# Patient Record
Sex: Female | Born: 1960 | Race: Black or African American | Hispanic: No | Marital: Married | Smoking: Current every day smoker
Health system: Southern US, Community
[De-identification: ages and names within clinical notes are randomized; demographics above are authoritative.]

## PROBLEM LIST (undated history)

## (undated) DIAGNOSIS — I10 Essential (primary) hypertension: Secondary | ICD-10-CM

## (undated) HISTORY — PX: ABDOMINAL HYSTERECTOMY: SHX81

---

## 2001-02-05 ENCOUNTER — Encounter: Admission: RE | Admit: 2001-02-05 | Discharge: 2001-02-05 | Payer: Self-pay | Admitting: Family Medicine

## 2001-02-05 ENCOUNTER — Encounter: Payer: Self-pay | Admitting: Family Medicine

## 2001-04-22 ENCOUNTER — Inpatient Hospital Stay (HOSPITAL_COMMUNITY): Admission: RE | Admit: 2001-04-22 | Discharge: 2001-04-24 | Payer: Self-pay | Admitting: Obstetrics and Gynecology

## 2001-04-22 ENCOUNTER — Encounter (INDEPENDENT_AMBULATORY_CARE_PROVIDER_SITE_OTHER): Payer: Self-pay

## 2002-10-04 ENCOUNTER — Ambulatory Visit (HOSPITAL_COMMUNITY): Admission: RE | Admit: 2002-10-04 | Discharge: 2002-10-04 | Payer: Self-pay | Admitting: Family Medicine

## 2002-10-04 ENCOUNTER — Encounter: Payer: Self-pay | Admitting: Family Medicine

## 2002-10-29 ENCOUNTER — Encounter: Payer: Self-pay | Admitting: Family Medicine

## 2002-10-29 ENCOUNTER — Ambulatory Visit (HOSPITAL_COMMUNITY): Admission: RE | Admit: 2002-10-29 | Discharge: 2002-10-29 | Payer: Self-pay | Admitting: Family Medicine

## 2003-05-16 ENCOUNTER — Other Ambulatory Visit: Admission: RE | Admit: 2003-05-16 | Discharge: 2003-05-16 | Payer: Self-pay | Admitting: Obstetrics and Gynecology

## 2003-05-23 ENCOUNTER — Encounter: Payer: Self-pay | Admitting: Family Medicine

## 2003-05-23 ENCOUNTER — Ambulatory Visit (HOSPITAL_COMMUNITY): Admission: RE | Admit: 2003-05-23 | Discharge: 2003-05-23 | Payer: Self-pay | Admitting: Family Medicine

## 2004-12-31 ENCOUNTER — Encounter: Admission: RE | Admit: 2004-12-31 | Discharge: 2004-12-31 | Payer: Self-pay | Admitting: Family Medicine

## 2005-05-08 ENCOUNTER — Ambulatory Visit (HOSPITAL_COMMUNITY): Admission: RE | Admit: 2005-05-08 | Discharge: 2005-05-08 | Payer: Self-pay | Admitting: Family Medicine

## 2005-05-13 ENCOUNTER — Encounter (HOSPITAL_COMMUNITY): Admission: RE | Admit: 2005-05-13 | Discharge: 2005-06-12 | Payer: Self-pay | Admitting: Family Medicine

## 2006-01-24 ENCOUNTER — Encounter: Admission: RE | Admit: 2006-01-24 | Discharge: 2006-01-24 | Payer: Self-pay | Admitting: Obstetrics and Gynecology

## 2007-09-18 ENCOUNTER — Emergency Department (HOSPITAL_COMMUNITY): Admission: EM | Admit: 2007-09-18 | Discharge: 2007-09-18 | Payer: Self-pay | Admitting: *Deleted

## 2008-01-01 ENCOUNTER — Encounter: Admission: RE | Admit: 2008-01-01 | Discharge: 2008-01-01 | Payer: Self-pay | Admitting: Obstetrics and Gynecology

## 2008-08-14 IMAGING — CR DG FOOT COMPLETE 3+V*R*
3 series · 3 of 3 positions shown · non-contrast
Comparison: none

CLINICAL DATA: Redness and swelling of right foot plus pain.
 RIGHT FOOT ? 3 VIEW:

[t foot ap right]
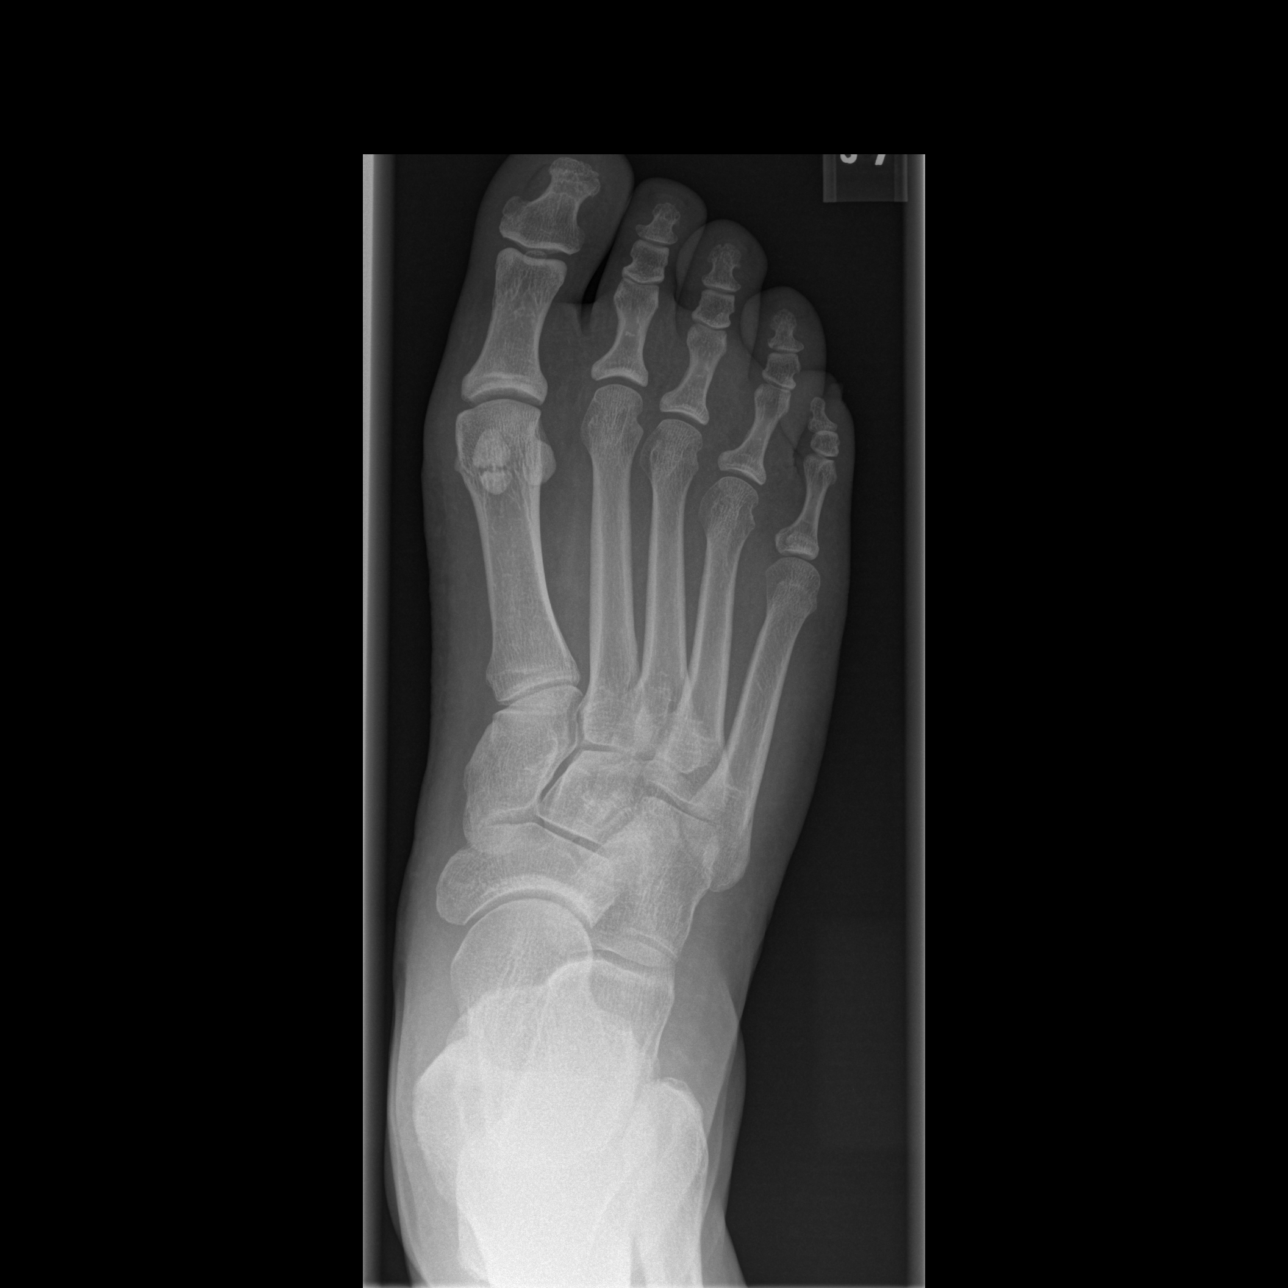

[t foot oblique right]
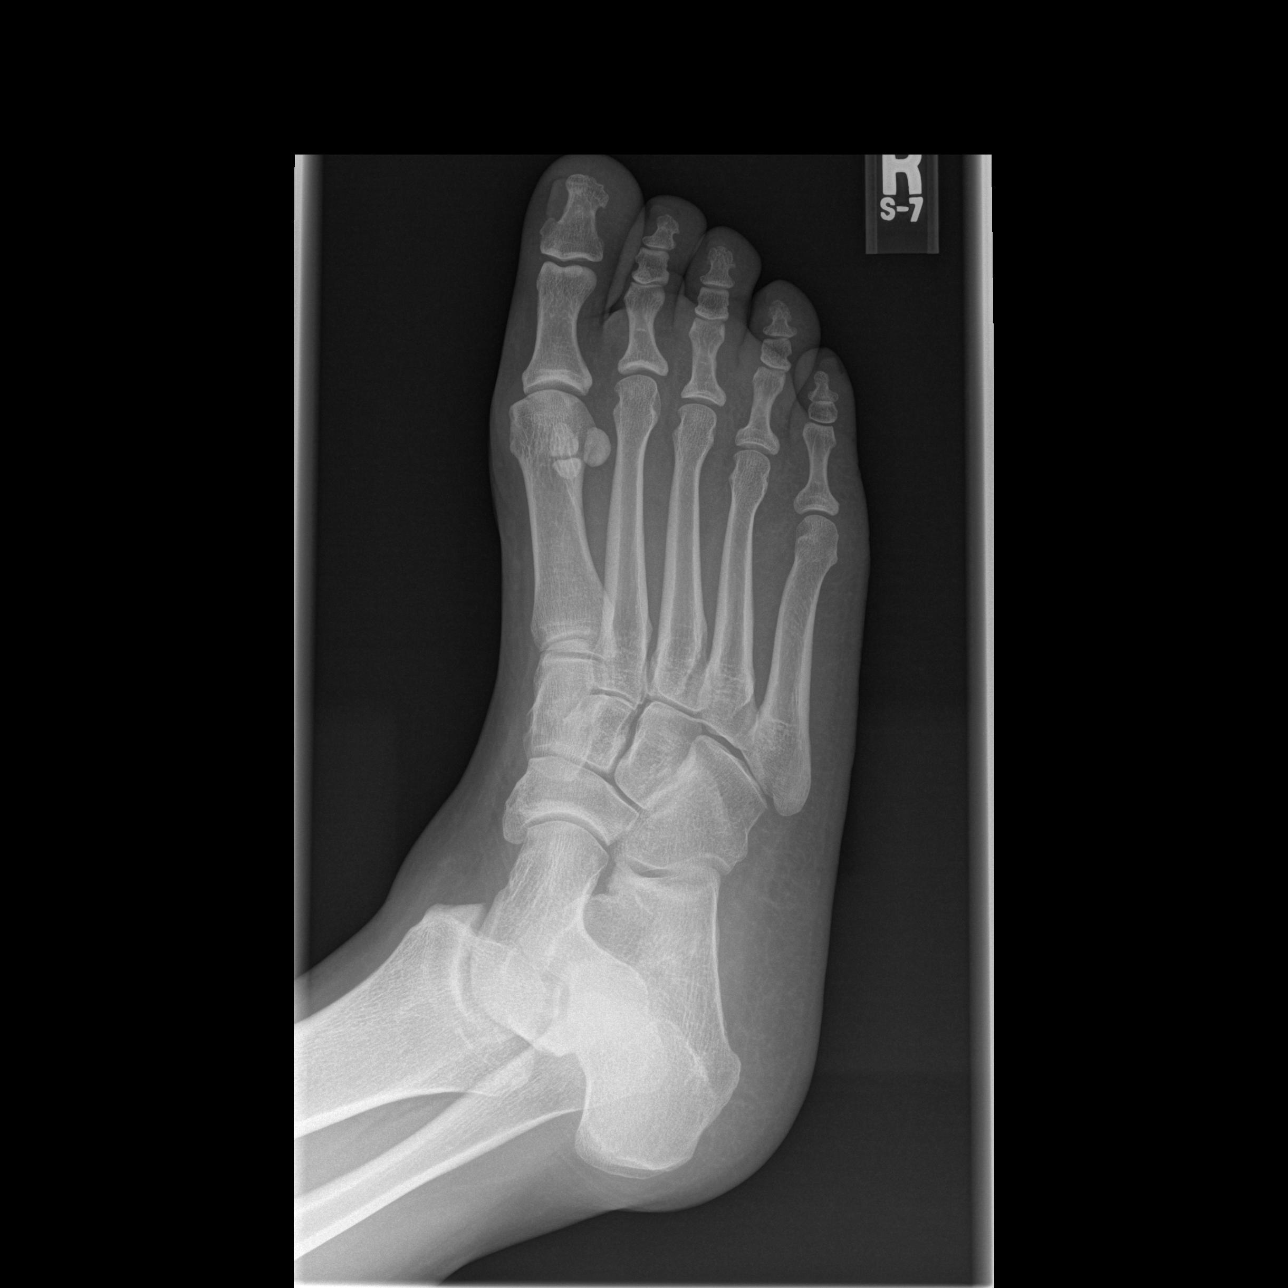

[t foot lat right]
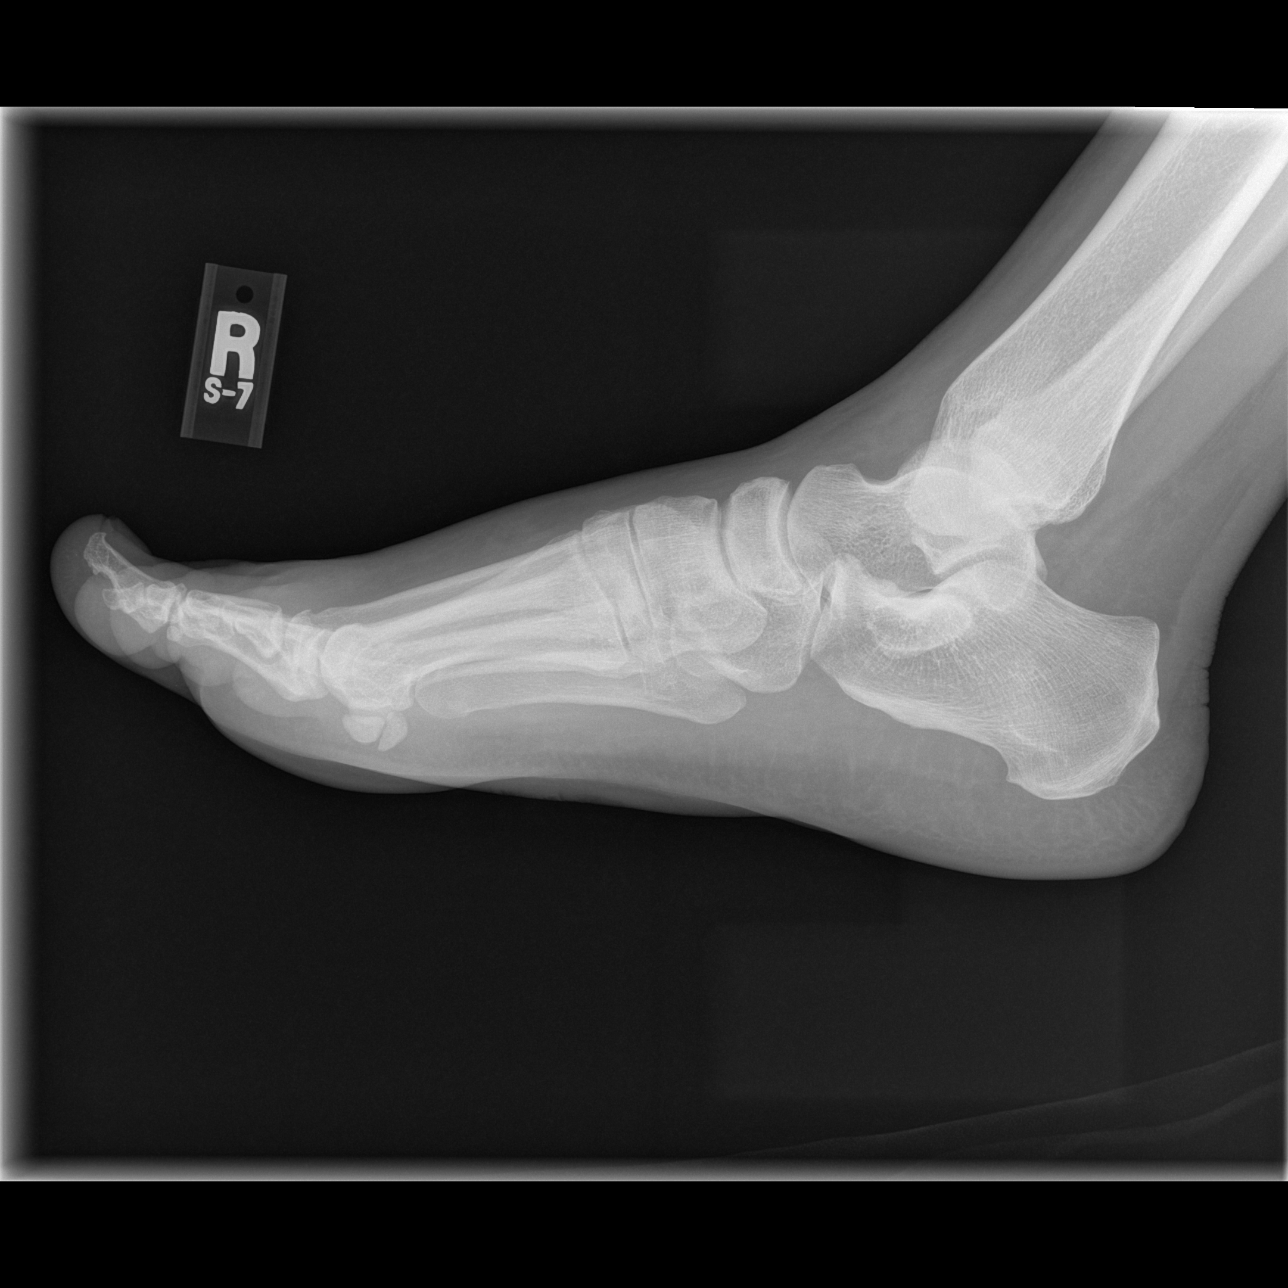

[3 of 3 positions shown; findings below may reference images not displayed]

FINDINGS: No acute bony abnormality.  No periosteal reaction.  Soft tissue swelling.  Mild bunion formation involving the great toe metatarsal.
IMPRESSION: No acute bony abnormality.

## 2009-01-04 ENCOUNTER — Encounter: Admission: RE | Admit: 2009-01-04 | Discharge: 2009-01-04 | Payer: Self-pay | Admitting: Obstetrics and Gynecology

## 2009-09-30 ENCOUNTER — Emergency Department (HOSPITAL_COMMUNITY): Admission: EM | Admit: 2009-09-30 | Discharge: 2009-09-30 | Payer: Self-pay | Admitting: Family Medicine

## 2010-02-02 ENCOUNTER — Encounter: Admission: RE | Admit: 2010-02-02 | Discharge: 2010-02-02 | Payer: Self-pay | Admitting: Obstetrics and Gynecology

## 2010-12-16 ENCOUNTER — Encounter: Payer: Self-pay | Admitting: Obstetrics and Gynecology

## 2011-02-19 ENCOUNTER — Other Ambulatory Visit: Payer: Self-pay | Admitting: Obstetrics and Gynecology

## 2011-02-19 DIAGNOSIS — Z1231 Encounter for screening mammogram for malignant neoplasm of breast: Secondary | ICD-10-CM

## 2011-02-25 ENCOUNTER — Ambulatory Visit
Admission: RE | Admit: 2011-02-25 | Discharge: 2011-02-25 | Disposition: A | Discharge status: Home / Self Care | Payer: 59 | Source: Ambulatory Visit | Attending: Obstetrics and Gynecology | Admitting: Obstetrics and Gynecology

## 2011-02-25 DIAGNOSIS — Z1231 Encounter for screening mammogram for malignant neoplasm of breast: Secondary | ICD-10-CM

## 2011-02-26 ENCOUNTER — Emergency Department (HOSPITAL_COMMUNITY)
Admission: EM | Admit: 2011-02-26 | Discharge: 2011-02-26 | Disposition: A | Discharge status: Home / Self Care | Payer: 59 | Attending: Emergency Medicine | Admitting: Emergency Medicine

## 2011-02-26 ENCOUNTER — Emergency Department (HOSPITAL_COMMUNITY): Payer: 59

## 2011-02-26 DIAGNOSIS — J3489 Other specified disorders of nose and nasal sinuses: Secondary | ICD-10-CM | POA: Insufficient documentation

## 2011-02-26 DIAGNOSIS — E039 Hypothyroidism, unspecified: Secondary | ICD-10-CM | POA: Insufficient documentation

## 2011-02-26 DIAGNOSIS — I10 Essential (primary) hypertension: Secondary | ICD-10-CM | POA: Insufficient documentation

## 2011-02-26 DIAGNOSIS — R51 Headache: Secondary | ICD-10-CM | POA: Insufficient documentation

## 2011-04-12 NOTE — Op Note (Signed)
[facility]  Patient:    Kelli Figueroa, Kelli Figueroa                   MRN: [id number] Proc. Date: 04/22/01 Attending:  Cordelia Pen A. Rosalio Macadamia, M.D.                           Operative Report  PREOPERATIVE DIAGNOSIS:       Fibroid uterus.  INDICATIONS:                  The patient is a 50 year old G3, P3-0-03 woman who is having menstrual periods every 24-28 days, lasting 6-7 days.  The patient has excessively heavy flow.  The patient has had known fibroids for over 10 years, but most recently has noted increased pain, pressure and excessive bleeding.  Ultrasound was performed showing fibroid uterus approximately 20-22 weeks in size.  The patient was treated with Lupron Depot in February, which is lasting three months.  The patient has been evaluated in the office since, with some deceased size to her uterus to 18-20 week size. Because of these findings, the patient is brought to the  operating room for total abdominal hysterectomy.  FINDINGS:                     Large, irregular uterus with multiple fibroids present, approximately 18-20 week size.  Normal fallopian tubes and ovaries.  SURGEON:                       Sherry A. Rosalio Macadamia, M.D.  DESCRIPTION OF PROCEDURE:     The patient was brought into the operating room and given adequate general anesthesia.  She was placed in the frog-leg position.  The abdomen and vagina were washed with Betadine.  A Foley catheter was placed in the bladder.  The patient was taken out of the frog-leg position and draped in a sterile fashion.  A previous midline incision was present. This was infiltrated with 0.5% Marcaine.  The old incision was excised and then incision was then brought down sharply through the fascia.  The fascia was incised sharply.  The peritoneum was identified and entered sharply.  This incision was extended superiorly and inferiorly under direct visualization. The uterus was delivered through the  abdominal incision with pressure and manipulation.  A long Kelly clamp was placed on the utero-ovarian ligaments. The right round ligament was identified.  It had been avulsed slightly.  This was doubly clamped.  It was cut.  It was suture ligated with 0 Vicryl ligatures.  Some small adhesions were dissected.  The utero-ovarian ligament was identified.  It was doubly clamped.  It was cut.  It was free tied with 0 Vicryl free tie and suture ligated with 0 Vicryl ligature.  The left round ligament was then identified.  It was tied with 0 Vicryl ligatures x 2 and cut in the middle.  The anterior leaf of the broad ligament was then incised and the bladder was developed off of the lower uterine segment.  The left ovary was attached to the uterus with a fallopian tube status post tubal ligation. The adhesions were dissected free with sharp and blunt dissection.  The utero-ovarian ligament was then doubly clamped.  It was cut.  It was free tied with 0 Vicryl free tie and then sutured with 0 Vicryl ligature.  The uterine arteries were skeletonized.  The bladder was developed  off of the lower uterine segment more.  The right uterine artery was doubly clamped, cut and suture ligated with 0 Vicryl ligature.  The beginning of the cardinal ligament was clamped, cut and suture ligated with 0 Vicryl ligature ______ with a second tie around the uterine.  There was significant back bleeding, although a second clamp had been placed to prevent this.  Once this was stopped with back sutures, the left uterine arteries were clamped x 2, cut and suture ligated with 0 Vicryl.  The beginning of the cardinal ligament was also clamped, cut and suture ligated to put a second tie around the uterine arteries.  THe cardinal ligaments were clamped, cut and suture ligated with 0 Vicryl ligature.  A small hematoma was felt to be forming in the right broad ligament.  This bleeding was stopped with another suture.  The ureters  were identified below each side of these in the pelvic side wall below the area of dissection.  The uterosacral ligaments clamped separately, cut and suture ligated with 0 Vicryl ligatures.  These stitches were continued until the angle stitches could be taken.  The angles were clamped, cut and suture ligated with 0 Vicryl.  The cervix was then excised from the vagina.  Prior to the extensive cardinal ligament clamping and dissection, the uterus had been removed from the cervix for better visualization.  The vagina was grasped with four Kocher clamps.  Vaginal tissues were closed with 0 Vicryl in a running locked whip stitch.  Once adequate hemostasis was present, the vaginal cuff was then closed with 0 Vicryl mattress-type sutures.  Bleeders were cauterized along the vaginal edges, along the peritoneum by the bladder and along the edge of the bladder.  A bleeder was still felt to be present in the left broad ligament.  This was clamped with a right angle clamp.  The area below the uterine arteries were then sutured with 3-0 Vicryl in a figure-of-eight stitch.  A small amount of bleeding was still present near the uterine artery on the right.  This was clamped with a Rogers clamp and suture ligated with 0 Vicryl ligature.  Once this was done, adequate hemostasis was present.  The hematoma that had been present approximately 2 cm in size was deflated and was not growing.  The ureters were again checked and felt to be well below the area of suturing.  There were no abnormalities seen.  The left tube and ovary was inspected. There was a small amount of bleeding below it.  This was cauterized and then an area was grasped with a right-angle clamp and free tie was taken with adequate hemostasis.  The left tube and ovary was tied to the left round ligament.  There was an area below it that was open that was felt to possibly be a risk for small bowel getting caught.  Therefore, the peritoneum was  closed between the round ligament and the tube and ovary to prevent any future small bowel obstruction.  The right tube and ovary was tied to the right round ligament to try to keep the ovaries out of the pelvis.  The  abdomen was irrigated with large amounts of warm saline.  Small bleeders were cauterized.  Adequate hemostasis was felt to be present.  Packs were removed from the abdomen.  A Balfour retractor that had been placed after removing the fundus of the uterus was then removed.  The peritoneal edges were cauterized for any bleeders.  The fascia was  closed with 0 PDS x 2 stitches running to the midline.  The incision was irrigated.  Bleeders were cauterized.  Some subcutaneous fat was excised with cautery in order to expedite the closure of the incision.  The subcutaneous tissues were then approximated with 3-0 plain in interrupted stitches to decrease any tension on the incision.  The incision was then closed with 4-0 Vicryl in subcuticular running stitch.  Adequate hemostasis was present.  Steri-Strips were placed on the incision.  Sterile bandages were placed on the incision.  The patient was awakened.  She was extubated.  She was moved from the operating table to a stretcher in stable condition.  COMPLICATIONS:                None.  ESTIMATED BLOOD LOSS:         500 cc. DD:  04/22/01 TD:  04/22/01 Job: [id number]

## 2011-04-12 NOTE — Discharge Summary (Signed)
[facility]  Patient:    Kelli Figueroa, Kelli Figueroa                  MRN: [id number] Adm. Date:  [id number] Disc. Date: [id number] Attending:  Morene Antu                           Discharge Summary  PROBLEM #1:                   Fibroid uterus.  SUBJECTIVE:                   The patient is a 50 year old, G3, P3-0-0-3 woman who is having menstrual periods every 24 to 28 days x 57 days. The patient has had excessively heavy menstrual flow for a long time. The patient has noted increasing pain, pressure, and excessive bleeding recently. The patient had an ultrasound performed showing a 20- to 22-week size uterus. She was treated with Lupron Depot in February which lasted three months. The patient had a significant reduction in size of her uterus down to approximately 18 to 20-week size. Because of this, the patient is brought to the operating room for hysterectomy.  OBJECTIVE: PHYSICAL EXAMINATION:  HEENT:                        Within normal limits.  NECK:                         Without any lymphadenopathy. Thyroid without nodule.  CHEST:                        Clear to auscultation.  HEART:                        Regular rhythm without murmur.  BREASTS:                      Without mass.  BACK:                         CVA nontender.  ABDOMEN:                      Soft nontender with an 18- to 20-week size uterus.  PELVIC:                       External genitalia within normal limits. Cervix within normal limits. Uterus 18- to 20-week size. Adnexa nonpalpable.  HOSPITAL COURSE:              The patient was admitted and brought to the operating room where a TAH was performed. There were no intraoperative or postoperative complications. Postoperatively, the patient did well. She remained afebrile. Her vital signs remained stable. Preoperative hematocrit was 39.8, postoperative hematocrit on her first postoperative day was  30.0. Urinalysis was within normal limits. Uterine pathology revealed benign endometrium, benign leiomyomata, and cervix with no pathologic abnormalities. The patient was discharged to home on her second postoperative day.  ASSESSMENT:                   Stable, status post total abdominal hysterectomy.  PLAN:  Follow up in the office in two to three weeks. The patient will call if she has a temperature greater than 101, heavy bleeding, or severe pain. She is discharged to home on Advil, Aleve, or Tylenol p.r.n. and Tylox, #15, to take one to two q.4h. p.r.n. DD:  05/13/01 TD:  05/13/01 Job: [id number]

## 2012-01-23 IMAGING — CT CT HEAD W/O CM
2 series · 16 of 30 positions shown, 20 images · non-contrast
Comparison: None.

CLINICAL DATA: Headache for 2 days, high blood pressure

CT HEAD WITHOUT CONTRAST
TECHNIQUE: Contiguous axial images were obtained from the base of
the skull through the vertex without contrast.

[Series 2: head w/o · axial · non-contrast · 0.49mm/px · z∈[+99,+229]mm · 13 of 32 slices shown, 17 images]
[im 3/32  brain]
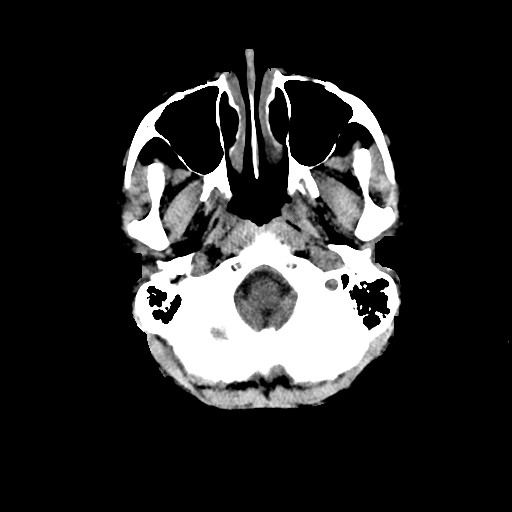
[im 3/32  bone]
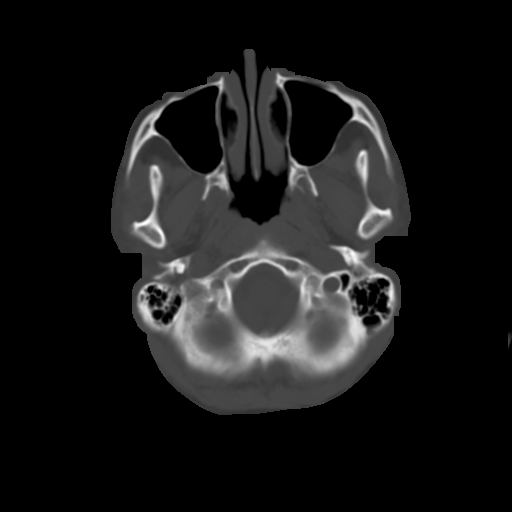
[im 5/32  brain]
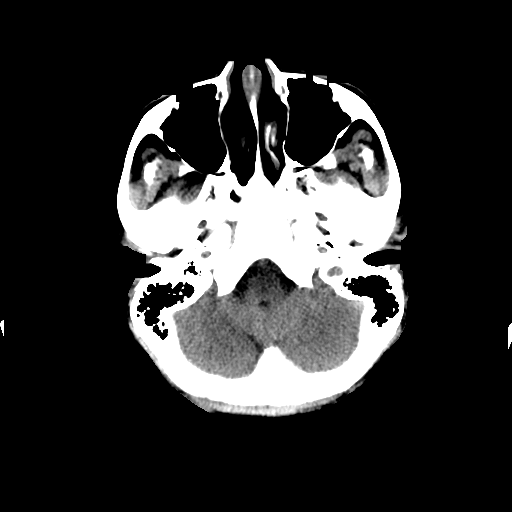
[im 7/32  brain]
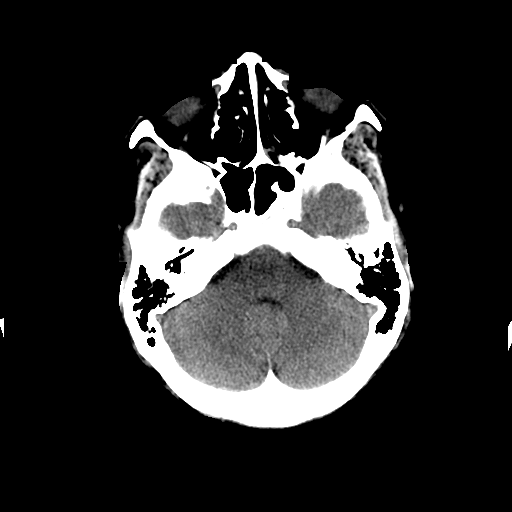
[im 9/32  brain]
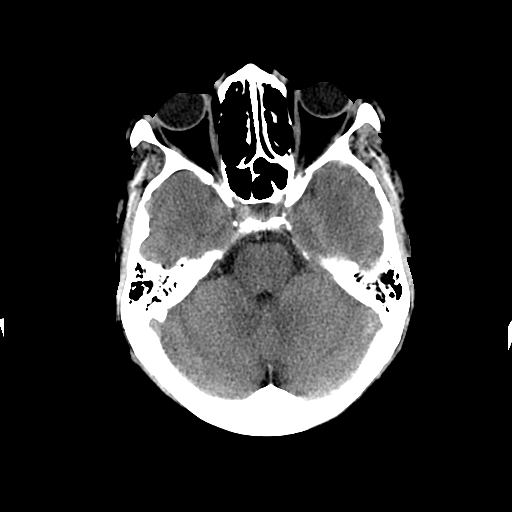
[im 12/32  brain]
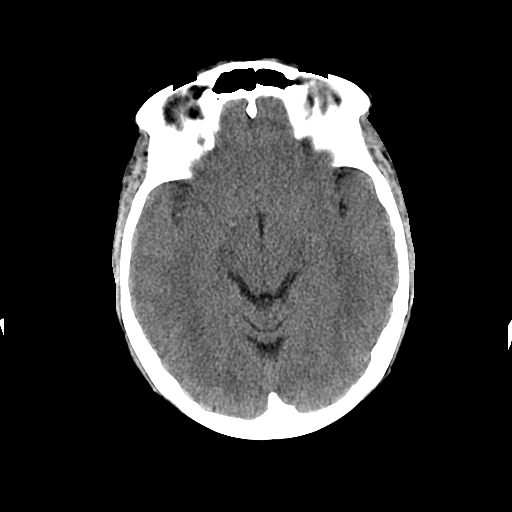
[im 12/32  bone]
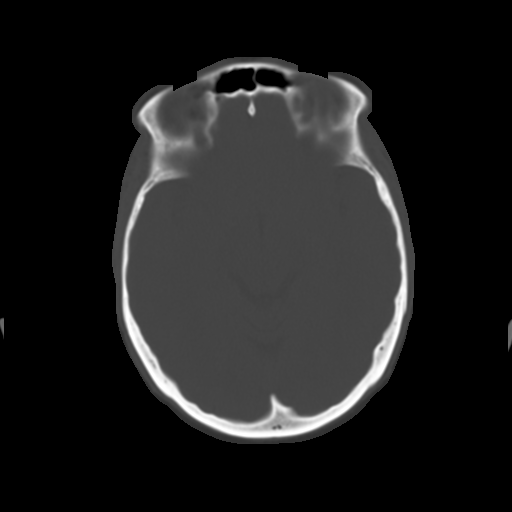
[im 14/32  brain]
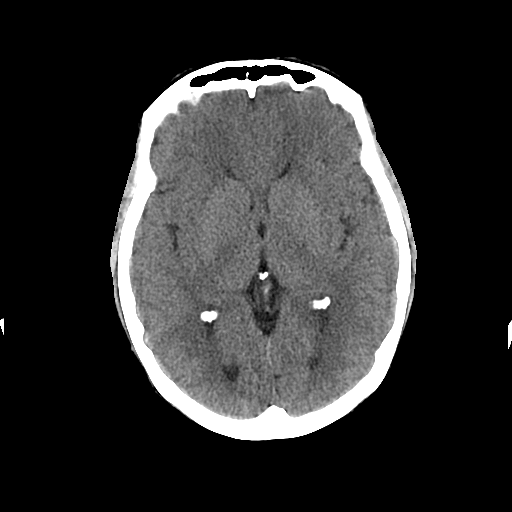
[im 16/32  brain]
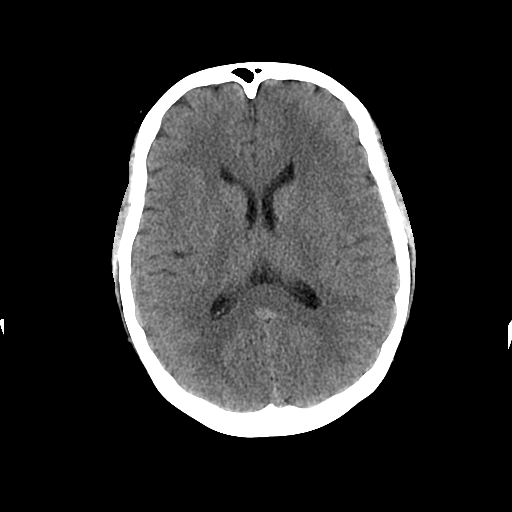
[im 18/32  brain]
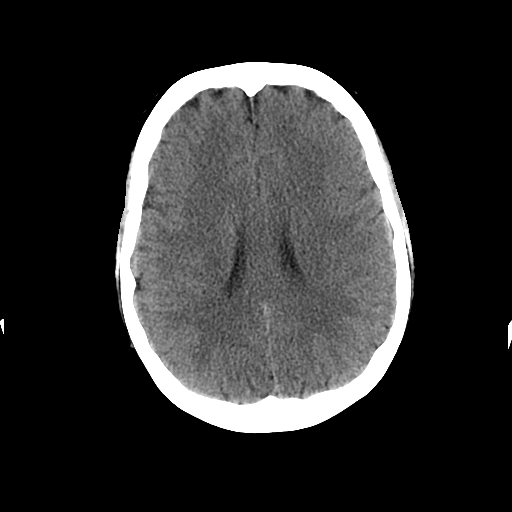
[im 20/32  brain]
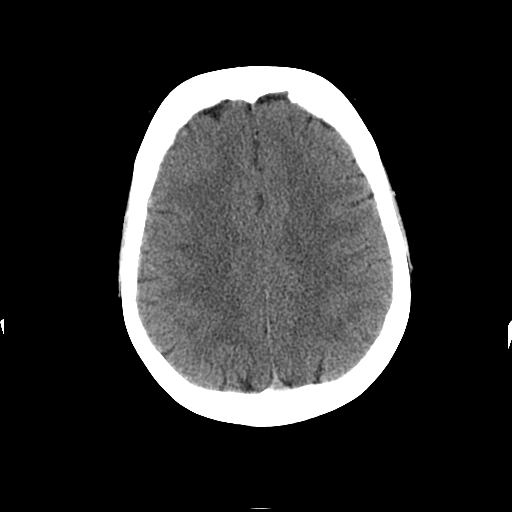
[im 20/32  bone]
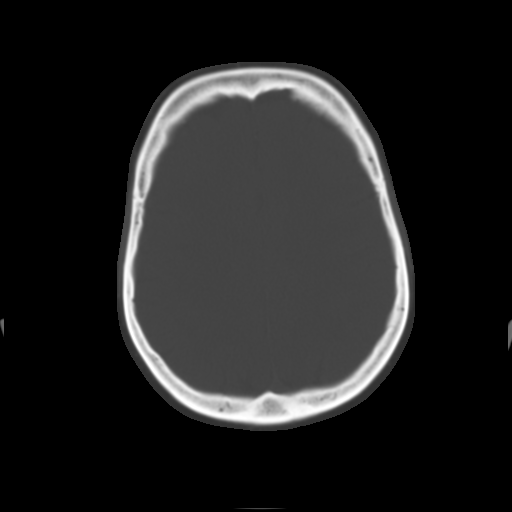
[im 23/32  brain]
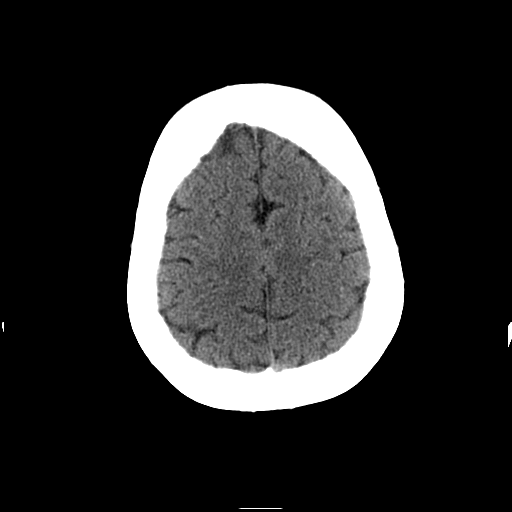
[im 25/32  brain]
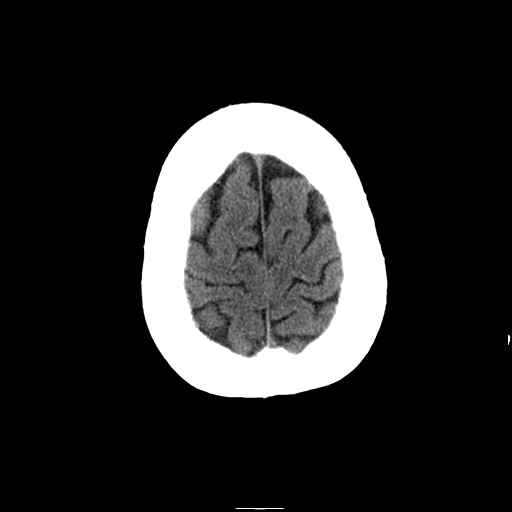
[im 27/32  brain]
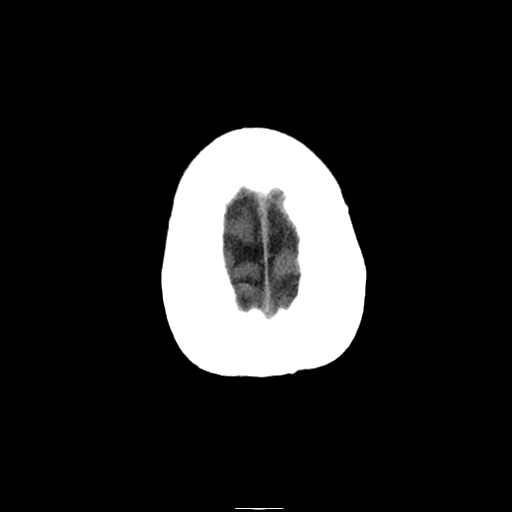
[im 29/32  brain]
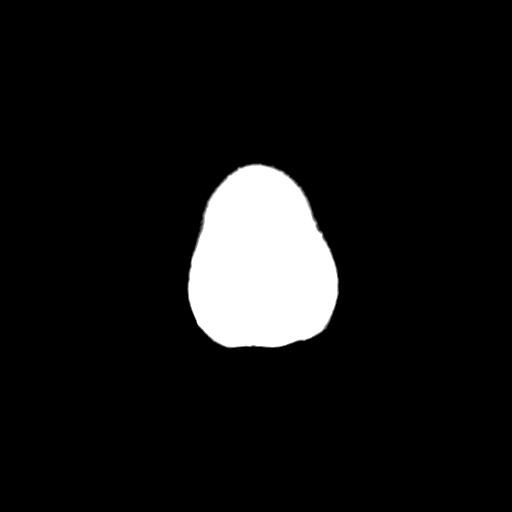
[im 29/32  bone]
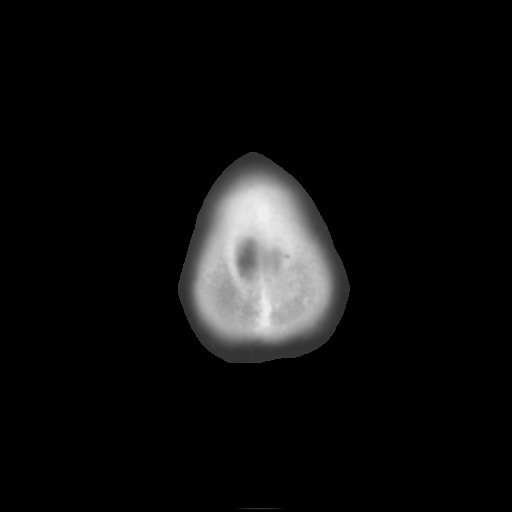

[Series 3: head w/o bone · axial · non-contrast · 0.49mm/px · z∈[+99,+144]mm · 3 of 32 slices shown]
[im 3/32  bone]
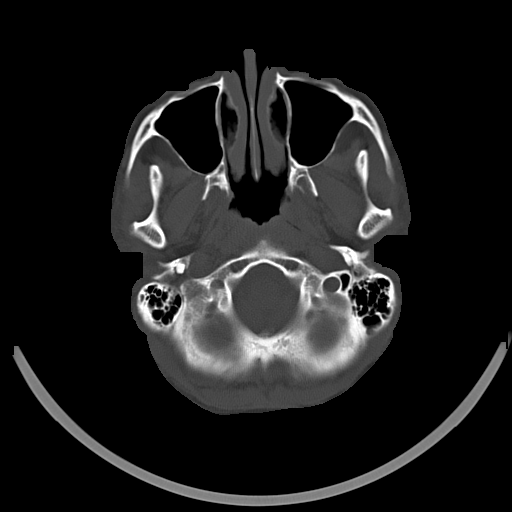
[im 7/32  bone]
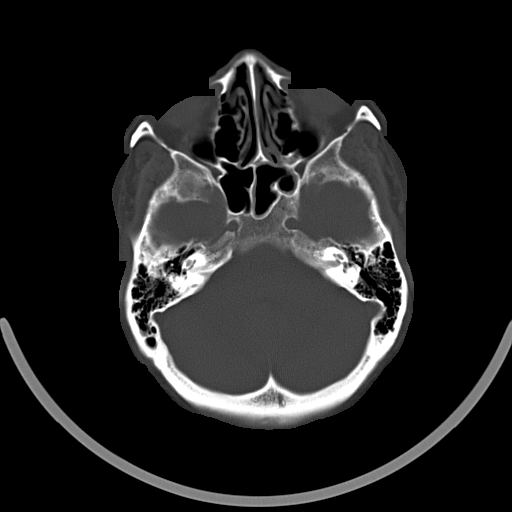
[im 12/32  bone]
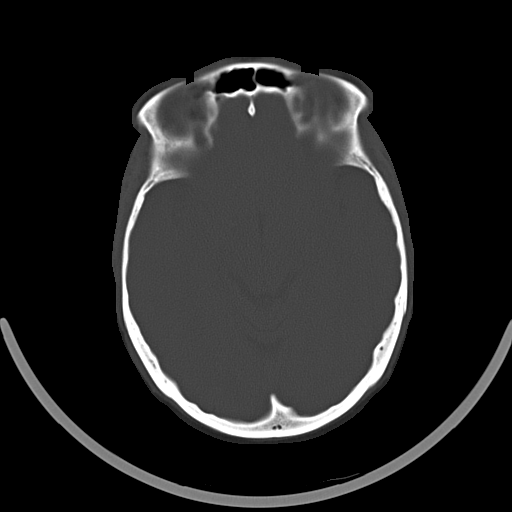

[16 of 30 positions shown; findings below may reference images not displayed]

FINDINGS: The ventricular system is normal in size and
configuration, and the basilar cisterns appear normal.  No
hemorrhage, mass effect, or acute infarction is seen.  Bone window
images show no calvarial abnormality.  The paranasal sinuses are
well pneumatized, as are the mastoid air cells.
IMPRESSION: Negative unenhanced CT of the brain.

## 2012-02-21 ENCOUNTER — Other Ambulatory Visit: Payer: Self-pay | Admitting: Obstetrics and Gynecology

## 2012-02-21 DIAGNOSIS — Z1231 Encounter for screening mammogram for malignant neoplasm of breast: Secondary | ICD-10-CM

## 2012-03-04 ENCOUNTER — Ambulatory Visit
Admission: RE | Admit: 2012-03-04 | Discharge: 2012-03-04 | Disposition: A | Discharge status: Home / Self Care | Payer: 59 | Source: Ambulatory Visit | Attending: Obstetrics and Gynecology | Admitting: Obstetrics and Gynecology

## 2012-03-04 DIAGNOSIS — Z1231 Encounter for screening mammogram for malignant neoplasm of breast: Secondary | ICD-10-CM

## 2013-04-26 ENCOUNTER — Other Ambulatory Visit: Payer: Self-pay

## 2013-04-26 DIAGNOSIS — Z1231 Encounter for screening mammogram for malignant neoplasm of breast: Secondary | ICD-10-CM

## 2013-05-24 ENCOUNTER — Ambulatory Visit: Payer: 59

## 2013-08-09 ENCOUNTER — Ambulatory Visit
Admission: RE | Admit: 2013-08-09 | Discharge: 2013-08-09 | Disposition: A | Discharge status: Home / Self Care | Payer: 59 | Source: Ambulatory Visit

## 2013-08-09 DIAGNOSIS — Z1231 Encounter for screening mammogram for malignant neoplasm of breast: Secondary | ICD-10-CM

## 2014-07-08 ENCOUNTER — Other Ambulatory Visit: Payer: Self-pay

## 2014-07-08 DIAGNOSIS — Z1231 Encounter for screening mammogram for malignant neoplasm of breast: Secondary | ICD-10-CM

## 2014-08-11 ENCOUNTER — Ambulatory Visit
Admission: RE | Admit: 2014-08-11 | Discharge: 2014-08-11 | Disposition: A | Discharge status: Home / Self Care | Payer: 59 | Source: Ambulatory Visit

## 2014-08-11 DIAGNOSIS — Z1231 Encounter for screening mammogram for malignant neoplasm of breast: Secondary | ICD-10-CM

## 2014-09-06 DIAGNOSIS — I10 Essential (primary) hypertension: Secondary | ICD-10-CM | POA: Insufficient documentation

## 2015-03-24 DIAGNOSIS — F17200 Nicotine dependence, unspecified, uncomplicated: Secondary | ICD-10-CM | POA: Insufficient documentation

## 2015-06-24 ENCOUNTER — Other Ambulatory Visit: Payer: Self-pay

## 2015-06-24 ENCOUNTER — Emergency Department
Admission: EM | Admit: 2015-06-24 | Discharge: 2015-06-24 | Disposition: A | Discharge status: Home / Self Care | Payer: 59 | Attending: Emergency Medicine | Admitting: Emergency Medicine

## 2015-06-24 ENCOUNTER — Encounter: Payer: Self-pay | Admitting: Emergency Medicine

## 2015-06-24 DIAGNOSIS — I10 Essential (primary) hypertension: Secondary | ICD-10-CM | POA: Diagnosis not present

## 2015-06-24 DIAGNOSIS — Z720 Tobacco use: Secondary | ICD-10-CM | POA: Insufficient documentation

## 2015-06-24 DIAGNOSIS — Z79899 Other long term (current) drug therapy: Secondary | ICD-10-CM | POA: Insufficient documentation

## 2015-06-24 DIAGNOSIS — M5412 Radiculopathy, cervical region: Secondary | ICD-10-CM | POA: Insufficient documentation

## 2015-06-24 DIAGNOSIS — M79602 Pain in left arm: Secondary | ICD-10-CM | POA: Diagnosis present

## 2015-06-24 HISTORY — DX: Essential (primary) hypertension: I10

## 2015-06-24 LAB — TROPONIN I

## 2015-06-24 LAB — CBC WITH DIFFERENTIAL/PLATELET
BASOS ABS: 0 10*3/uL (ref 0–0.1)
BASOS PCT: 1 %
Eosinophils Absolute: 0.4 10*3/uL (ref 0–0.7)
Eosinophils Relative: 6 %
HCT: 39.8 % (ref 35.0–47.0)
Hemoglobin: 13.2 g/dL (ref 12.0–16.0)
Lymphocytes Relative: 50 %
Lymphs Abs: 3.6 10*3/uL (ref 1.0–3.6)
MCH: 28.8 pg (ref 26.0–34.0)
MCHC: 33.1 g/dL (ref 32.0–36.0)
MCV: 87.2 fL (ref 80.0–100.0)
Monocytes Absolute: 0.7 10*3/uL (ref 0.2–0.9)
Monocytes Relative: 9 %
NEUTROS ABS: 2.4 10*3/uL (ref 1.4–6.5)
NEUTROS PCT: 34 %
PLATELETS: 191 10*3/uL (ref 150–440)
RBC: 4.56 MIL/uL (ref 3.80–5.20)
RDW: 13.7 % (ref 11.5–14.5)
WBC: 7.1 10*3/uL (ref 3.6–11.0)

## 2015-06-24 LAB — BASIC METABOLIC PANEL
ANION GAP: 7 (ref 5–15)
BUN: 13 mg/dL (ref 6–20)
CALCIUM: 9.4 mg/dL (ref 8.9–10.3)
CO2: 29 mmol/L (ref 22–32)
Chloride: 103 mmol/L (ref 101–111)
Creatinine, Ser: 0.66 mg/dL (ref 0.44–1.00)
GFR calc Af Amer: >60 mL/min (ref >60)
GFR calc non Af Amer: >60 mL/min (ref >60)
Glucose, Bld: 91 mg/dL (ref 65–99)
POTASSIUM: 3.3 mmol/L — AB (ref 3.5–5.1)
SODIUM: 139 mmol/L (ref 135–145)

## 2015-06-24 MED ORDER — CYCLOBENZAPRINE HCL 10 MG PO TABS: 10.0000 mg | ORAL_TABLET | Freq: Three times a day (TID) | ORAL | Status: AC | PRN

## 2015-06-24 NOTE — ED Notes (Addendum)
Patient reports throbbing pain to left arm. Denies any chest pain, shortness of breath, nausea, or dizziness. 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 Patient sent from [facility]. Patient's BP elevated in triage, states she has had HTN lately. Also reports increased urination lately.

## 2015-06-24 NOTE — ED Provider Notes (Signed)
[facility] Emergency Department Provider Note  ____________________________________________  Time seen: Approximately 12:15 PM  I have reviewed the triage vital signs and the nursing notes.   HISTORY  Chief Complaint Arm Injury    HPI Kelli Figueroa is a 54 y.o. female with a history of hypertension and borderline diabetes who presents today with left arm aching and tingling since last PM. She denies any shortness of breath, chest pain, nausea or dizziness. 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 The aching which is mild to moderate worsens with the lifting of trays. She says there is also burning pain down her arm with this that starts in her posterior left shoulder. She has sciatica on the right lower extremity which she says feels identical. Denies any history of heart attack and her family. Was sent here from her primary care doctor's office for evaluation.Says the pain lasted about 30 minutes at a time.   Past Medical History  Diagnosis Date  . Hypertension     There are no active problems to display for this patient.   Past Surgical History  Procedure Laterality Date  . Abdominal hysterectomy      Current Outpatient Rx  Name  Route  Sig  Dispense  Refill  . losartan (COZAAR) 100 MG tablet   Oral   Take 100 mg by mouth daily.         . Multiple Vitamin (MULTIVITAMIN) tablet   Oral   Take 1 tablet by mouth daily.         . varenicline (CHANTIX) 0.5 MG tablet   Oral   Take 0.5 mg by mouth 2 (two) times daily.           Allergies Review of patient's allergies indicates no known allergies.  No family history on file.  Social History History  Substance Use Topics  . Smoking status: Current Every Day Smoker  . Smokeless tobacco: Never Used  . Alcohol Use: No    Review of Systems Constitutional: No fever/chills Eyes: No visual changes. ENT: No sore  throat. Cardiovascular: Denies chest pain. Respiratory: Denies shortness of breath. Gastrointestinal: No abdominal pain.  No nausea, no vomiting.  No diarrhea.  No constipation. Genitourinary: Negative for dysuria. Musculoskeletal: Negative for back pain. Skin: Negative for rash. Neurological: Negative for headaches, focal weakness  10-point ROS otherwise negative.  ____________________________________________   PHYSICAL EXAM:  VITAL SIGNS: ED Triage Vitals  Enc Vitals Group     BP 06/24/15 1205 163/81 mmHg     Pulse Rate 06/24/15 1205 69     Resp 06/24/15 1205 18     Temp 06/24/15 1205 97.9 F (36.6 C)     Temp Source 06/24/15 1205 Oral     SpO2 06/24/15 1205 100 %     Weight 06/24/15 1205 194 lb (87.998 kg)     Height 06/24/15 1205 5\' 5"  (1.651 m)     Head Cir --      Peak Flow --      Pain Score 06/24/15 1206 5     Pain Loc --      Pain Edu? --      Excl. in GC? --     Constitutional: Alert and oriented. Well appearing and in no acute distress. Eyes: Conjunctivae are normal. PERRL. EOMI. Head: Atraumatic. Nose: No congestion/rhinnorhea. Mouth/Throat: Mucous membranes are moist.  Oropharynx non-erythematous. Neck: No stridor.  No tenderness to  midline C-spine. Cardiovascular: Normal rate, regular rhythm. Grossly normal heart sounds.  Good peripheral circulation. Respiratory: Normal respiratory effort.  No retractions. Lungs CTAB. Gastrointestinal: Soft and nontender. No distention. No abdominal bruits. No CVA tenderness. Musculoskeletal: No lower extremity tenderness nor edema.  No joint effusions. Left upper extremity tenderness to the belly of the brachial radialis muscle. Sensation intact to light touch. Bilateral radial pulses are intact. Full grip strength bilaterally. Neurologic:  Normal speech and language. No gross focal neurologic deficits are appreciated. No gait instability. Skin:  Skin is warm, dry and intact. No rash noted. Psychiatric: Mood and affect  are normal. Speech and behavior are normal.  ____________________________________________   LABS (all labs ordered are listed, but only abnormal results are displayed)  Labs Reviewed  BASIC METABOLIC PANEL - Abnormal; Notable for the following:    Potassium 3.3 (*)    All other components within normal limits  CBC WITH DIFFERENTIAL/PLATELET  TROPONIN I   ____________________________________________  EKG   ED ECG REPORT I, Merlina Marchena,  Teena Irani, the attending physician, personally viewed and interpreted this ECG.   Date: 06/24/2015  EKG Time: 1231  Rate: 69  Rhythm: normal sinus rhythm  Axis: Normal axis  Intervals:none  ST&T Change: No ST elevations or depressions. No abnormal T-wave inversions.  ____________________________________________  RADIOLOGY  ____________________________________________   PROCEDURES    ____________________________________________   INITIAL IMPRESSION / ASSESSMENT AND PLAN / ED COURSE  Pertinent labs & imaging results that were available during my care of the patient were reviewed by me and considered in my medical decision making (see chart for details).  ----------------------------------------- 1:34 PM on 06/24/2015 -----------------------------------------  Patient with reassuring blood work as well as EKG. Likely is a cervical radiculopathy. Patient already with anti-inflammatory medication at home. Recommended a muscle rub such as Aspercreme or icy hot. We'll also discharge and a muscle relaxer. We'll discharge to home. Follow-up with primary care doctor. Symptoms likely secondary to overuse. Resting comfortably at this time. ____________________________________________   FINAL CLINICAL IMPRESSION(S) / ED DIAGNOSES  Acute  radiculopathy. Initial visit.     Myrna Blazer, MD 06/24/15 [phone]

## 2015-06-24 NOTE — Discharge Instructions (Signed)

## 2015-07-05 ENCOUNTER — Other Ambulatory Visit: Payer: Self-pay

## 2015-07-05 DIAGNOSIS — Z1231 Encounter for screening mammogram for malignant neoplasm of breast: Secondary | ICD-10-CM

## 2015-08-16 ENCOUNTER — Ambulatory Visit
Admission: RE | Admit: 2015-08-16 | Discharge: 2015-08-16 | Disposition: A | Discharge status: Home / Self Care | Payer: 59 | Source: Ambulatory Visit

## 2015-08-16 DIAGNOSIS — Z1231 Encounter for screening mammogram for malignant neoplasm of breast: Secondary | ICD-10-CM

## 2016-10-25 ENCOUNTER — Other Ambulatory Visit: Payer: Self-pay | Admitting: Family Medicine

## 2016-10-25 DIAGNOSIS — Z1231 Encounter for screening mammogram for malignant neoplasm of breast: Secondary | ICD-10-CM

## 2016-11-29 ENCOUNTER — Ambulatory Visit: Payer: 59

## 2016-12-21 DIAGNOSIS — M545 Low back pain: Secondary | ICD-10-CM | POA: Diagnosis not present

## 2017-04-18 DIAGNOSIS — R7303 Prediabetes: Secondary | ICD-10-CM | POA: Diagnosis not present

## 2017-04-18 DIAGNOSIS — I10 Essential (primary) hypertension: Secondary | ICD-10-CM | POA: Diagnosis not present

## 2017-05-05 DIAGNOSIS — I10 Essential (primary) hypertension: Secondary | ICD-10-CM | POA: Diagnosis not present

## 2017-05-05 DIAGNOSIS — R7303 Prediabetes: Secondary | ICD-10-CM | POA: Diagnosis not present

## 2017-10-31 DIAGNOSIS — R7303 Prediabetes: Secondary | ICD-10-CM | POA: Diagnosis not present

## 2017-10-31 DIAGNOSIS — I10 Essential (primary) hypertension: Secondary | ICD-10-CM | POA: Diagnosis not present

## 2017-11-04 DIAGNOSIS — Z0000 Encounter for general adult medical examination without abnormal findings: Secondary | ICD-10-CM | POA: Diagnosis not present

## 2017-11-04 DIAGNOSIS — R7303 Prediabetes: Secondary | ICD-10-CM | POA: Diagnosis not present

## 2017-11-04 DIAGNOSIS — E6609 Other obesity due to excess calories: Secondary | ICD-10-CM | POA: Insufficient documentation

## 2017-11-04 DIAGNOSIS — I10 Essential (primary) hypertension: Secondary | ICD-10-CM | POA: Diagnosis not present

## 2017-11-06 DIAGNOSIS — S46812A Strain of other muscles, fascia and tendons at shoulder and upper arm level, left arm, initial encounter: Secondary | ICD-10-CM | POA: Diagnosis not present

## 2017-11-06 DIAGNOSIS — M62838 Other muscle spasm: Secondary | ICD-10-CM | POA: Diagnosis not present

## 2017-11-06 DIAGNOSIS — M545 Low back pain: Secondary | ICD-10-CM | POA: Diagnosis not present

## 2017-11-26 DIAGNOSIS — Z01419 Encounter for gynecological examination (general) (routine) without abnormal findings: Secondary | ICD-10-CM | POA: Diagnosis not present

## 2017-11-26 DIAGNOSIS — Z1231 Encounter for screening mammogram for malignant neoplasm of breast: Secondary | ICD-10-CM | POA: Diagnosis not present

## 2018-04-13 DIAGNOSIS — S1096XA Insect bite of unspecified part of neck, initial encounter: Secondary | ICD-10-CM | POA: Diagnosis not present

## 2018-04-13 DIAGNOSIS — W57XXXA Bitten or stung by nonvenomous insect and other nonvenomous arthropods, initial encounter: Secondary | ICD-10-CM | POA: Diagnosis not present

## 2018-04-28 DIAGNOSIS — I10 Essential (primary) hypertension: Secondary | ICD-10-CM | POA: Diagnosis not present

## 2018-04-28 DIAGNOSIS — R7303 Prediabetes: Secondary | ICD-10-CM | POA: Diagnosis not present

## 2018-05-05 DIAGNOSIS — I10 Essential (primary) hypertension: Secondary | ICD-10-CM | POA: Diagnosis not present

## 2018-05-05 DIAGNOSIS — R7303 Prediabetes: Secondary | ICD-10-CM | POA: Diagnosis not present

## 2018-11-09 DIAGNOSIS — F17200 Nicotine dependence, unspecified, uncomplicated: Secondary | ICD-10-CM | POA: Diagnosis not present

## 2018-11-09 DIAGNOSIS — R7303 Prediabetes: Secondary | ICD-10-CM | POA: Diagnosis not present

## 2018-11-09 DIAGNOSIS — I10 Essential (primary) hypertension: Secondary | ICD-10-CM | POA: Diagnosis not present

## 2018-11-30 DIAGNOSIS — Z01419 Encounter for gynecological examination (general) (routine) without abnormal findings: Secondary | ICD-10-CM | POA: Diagnosis not present

## 2018-11-30 DIAGNOSIS — Z6830 Body mass index (BMI) 30.0-30.9, adult: Secondary | ICD-10-CM | POA: Diagnosis not present

## 2018-11-30 DIAGNOSIS — Z1231 Encounter for screening mammogram for malignant neoplasm of breast: Secondary | ICD-10-CM | POA: Diagnosis not present

## 2018-12-07 ENCOUNTER — Other Ambulatory Visit: Payer: Self-pay | Admitting: Obstetrics and Gynecology

## 2018-12-07 DIAGNOSIS — N951 Menopausal and female climacteric states: Secondary | ICD-10-CM

## 2019-01-19 DIAGNOSIS — M25562 Pain in left knee: Secondary | ICD-10-CM | POA: Diagnosis not present

## 2019-01-25 ENCOUNTER — Other Ambulatory Visit: Payer: 59

## 2019-03-24 DIAGNOSIS — R0982 Postnasal drip: Secondary | ICD-10-CM | POA: Diagnosis not present

## 2019-03-24 DIAGNOSIS — F17200 Nicotine dependence, unspecified, uncomplicated: Secondary | ICD-10-CM | POA: Diagnosis not present

## 2019-03-24 DIAGNOSIS — I10 Essential (primary) hypertension: Secondary | ICD-10-CM | POA: Diagnosis not present

## 2019-04-13 DIAGNOSIS — M79672 Pain in left foot: Secondary | ICD-10-CM | POA: Diagnosis not present

## 2019-04-26 DIAGNOSIS — Z8669 Personal history of other diseases of the nervous system and sense organs: Secondary | ICD-10-CM | POA: Insufficient documentation

## 2019-04-26 DIAGNOSIS — R739 Hyperglycemia, unspecified: Secondary | ICD-10-CM | POA: Insufficient documentation

## 2019-04-27 ENCOUNTER — Ambulatory Visit: Payer: 59 | Admitting: Podiatry

## 2019-04-27 ENCOUNTER — Other Ambulatory Visit: Payer: Self-pay

## 2019-04-27 ENCOUNTER — Ambulatory Visit (INDEPENDENT_AMBULATORY_CARE_PROVIDER_SITE_OTHER): Payer: 59

## 2019-04-27 ENCOUNTER — Other Ambulatory Visit: Payer: Self-pay | Admitting: Podiatry

## 2019-04-27 ENCOUNTER — Encounter: Payer: Self-pay | Admitting: Podiatry

## 2019-04-27 VITALS — BP 162/86 | HR 77 | Temp 97.8°F

## 2019-04-27 DIAGNOSIS — M779 Enthesopathy, unspecified: Secondary | ICD-10-CM | POA: Diagnosis not present

## 2019-04-27 DIAGNOSIS — M778 Other enthesopathies, not elsewhere classified: Secondary | ICD-10-CM

## 2019-04-27 DIAGNOSIS — M7752 Other enthesopathy of left foot: Secondary | ICD-10-CM

## 2019-04-27 DIAGNOSIS — M7750 Other enthesopathy of unspecified foot: Secondary | ICD-10-CM

## 2019-04-27 MED ORDER — MELOXICAM 15 MG PO TABS: 15.0000 mg | ORAL_TABLET | Freq: Every day | ORAL | 1 refills | Status: AC

## 2019-04-27 MED ORDER — METHYLPREDNISOLONE 4 MG PO TBPK: ORAL_TABLET | ORAL | 0 refills | Status: DC

## 2019-04-27 NOTE — Progress Notes (Signed)
   HPI: 58 year old female presenting today as a new patient with a chief complaint of sharp, shooting, burning pain located to the dorsal right foot that began about two weeks ago. She states walking increases the pain. She notes she normally walks about 30 minutes per day for exercise and this pain is making that difficult. She has not done anything for treatment. Patient is here for further evaluation and treatment.   Past Medical History:  Diagnosis Date  . Hypertension      Physical Exam: General: The patient is alert and oriented x3 in no acute distress.  Dermatology: Skin is warm, dry and supple bilateral lower extremities. Negative for open lesions or macerations.  Vascular: Palpable pedal pulses bilaterally. No edema or erythema noted. Capillary refill within normal limits.  Neurological: Epicritic and protective threshold grossly intact bilaterally.   Musculoskeletal Exam: Pain with palpation noted overlying the extensor tendons of the left foot. Range of motion within normal limits to all pedal and ankle joints bilateral. Muscle strength 5/5 in all groups bilateral.   Radiographic Exam:  Normal osseous mineralization. Joint spaces preserved. No fracture/dislocation/boney destruction.    Assessment: 1. Extensor tendinitis left foot    Plan of Care:  1. Patient evaluated. X-Rays reviewed.  2. Injection of 0.5 mLs Celestone Soluspan injected into the extensor tendon sheath of the left foot.  3. Prescription for Medrol Dose Pak provided to patient. 4. Prescription for Meloxicam provided to patient. 5. Recommended good shoe gear.  6. Return to clinic as needed.   Works at [employer] for the past 20 years.      Felecia Shelling, DPM [facility]  Dr. Felecia Shelling, DPM    [address] [place], [place] [address]                Office [phone]  Fax [phone]

## 2020-05-04 DIAGNOSIS — E7800 Pure hypercholesterolemia, unspecified: Secondary | ICD-10-CM | POA: Insufficient documentation

## 2021-04-10 ENCOUNTER — Other Ambulatory Visit: Payer: Self-pay | Admitting: Obstetrics and Gynecology

## 2021-04-10 DIAGNOSIS — E2839 Other primary ovarian failure: Secondary | ICD-10-CM

## 2021-05-15 ENCOUNTER — Ambulatory Visit: Payer: 59 | Admitting: Podiatry

## 2021-05-15 ENCOUNTER — Encounter: Payer: Self-pay | Admitting: Podiatry

## 2021-05-15 ENCOUNTER — Encounter: Payer: Self-pay | Admitting: *Deleted

## 2021-05-15 ENCOUNTER — Other Ambulatory Visit: Payer: Self-pay

## 2021-05-15 DIAGNOSIS — L600 Ingrowing nail: Secondary | ICD-10-CM | POA: Diagnosis not present

## 2021-05-15 DIAGNOSIS — I10 Essential (primary) hypertension: Secondary | ICD-10-CM | POA: Insufficient documentation

## 2021-05-15 DIAGNOSIS — E119 Type 2 diabetes mellitus without complications: Secondary | ICD-10-CM | POA: Insufficient documentation

## 2021-05-15 MED ORDER — GENTAMICIN SULFATE 0.1 % EX CREA: 1.0000 "application " | TOPICAL_CREAM | Freq: Two times a day (BID) | CUTANEOUS | 1 refills | Status: AC

## 2021-05-15 NOTE — Progress Notes (Addendum)
   Subjective: Patient presents today for evaluation of pain to the medial border right great toe. Patient is concerned for possible ingrown nail.  It is very sensitive to touch.  Patient presents today for further treatment and evaluation.  Past Medical History:  Diagnosis Date   Hypertension     Objective:  General: Well developed, nourished, in no acute distress, alert and oriented x3   Dermatology: Skin is warm, dry and supple bilateral.  Medial border right great toe appears to be erythematous with evidence of an ingrowing nail. Pain on palpation noted to the border of the nail fold. The remaining nails appear unremarkable at this time. There are no open sores, lesions.  Vascular: Dorsalis Pedis artery and Posterior Tibial artery pedal pulses palpable. No lower extremity edema noted.   Neruologic: Grossly intact via light touch bilateral.  Musculoskeletal: Muscular strength within normal limits in all groups bilateral. Normal range of motion noted to all pedal and ankle joints.   Assesement: #1 Paronychia with ingrowing nail medial border right great toe #2 Pain in toe  Plan of Care:  1. Patient evaluated.  2. Discussed treatment alternatives and plan of care. Explained nail avulsion procedure and post procedure course to patient. 3. Patient opted for permanent partial nail avulsion of the medial border right great toe.  4. Prior to procedure, local anesthesia infiltration utilized using 3 ml of a 50:50 mixture of 2% plain lidocaine and 0.5% plain marcaine in a normal hallux block fashion and a betadine prep performed.  5. Partial permanent nail avulsion with chemical matrixectomy performed using 3x30sec applications of phenol followed by alcohol flush.  6. Light dressing applied.  Post care instructions provided 7.  Prescription for gentamicin 2% cream  8.  CAM boot dispensed at the patient's request. WB as tolerated.  9. Return to clinic 2 weeks.  Felecia Shelling, DPM [facility]  Dr. Felecia Shelling, DPM    [address] [place], [place] [address]                Office [phone]  Fax [phone]

## 2021-05-15 NOTE — Patient Instructions (Signed)

## 2021-05-29 ENCOUNTER — Encounter: Payer: Self-pay | Admitting: Podiatry

## 2021-05-29 ENCOUNTER — Ambulatory Visit (INDEPENDENT_AMBULATORY_CARE_PROVIDER_SITE_OTHER): Payer: 59 | Admitting: Podiatry

## 2021-05-29 ENCOUNTER — Other Ambulatory Visit: Payer: Self-pay

## 2021-05-29 DIAGNOSIS — L600 Ingrowing nail: Secondary | ICD-10-CM

## 2021-05-29 NOTE — Progress Notes (Signed)
   Subjective: 60 y.o. female presents today status post permanent nail avulsion procedure of the medial border RT great toe that was performed on 05/15/2021. Patient states that she if feeling much better. No new complaints at this time.   Past Medical History:  Diagnosis Date   Hypertension     Objective: Skin is warm, dry and supple. Nail and respective nail fold appears to be healing appropriately. Open wound to the associated nail fold with a granular wound base and moderate amount of fibrotic tissue. Minimal drainage noted. Mild erythema around the periungual region likely due to phenol chemical matricectomy.  Assessment: #1 s/p partial permanent nail matrixectomy Medial border RT great toe   Plan of care: #1 patient was evaluated  #2 light debridement of open wound was performed to the periungual border of the respective toe using a currette. Antibiotic ointment and Band-Aid was applied. #3 patient is to return to clinic on a PRN basis.   Felecia Shelling, DPM [facility]  Dr. Felecia Shelling, DPM    [address] [place], [place] [address]                Office [phone]  Fax [phone]

## 2021-10-22 ENCOUNTER — Inpatient Hospital Stay: Admission: RE | Admit: 2021-10-22 | Payer: 59 | Source: Ambulatory Visit

## 2024-02-11 ENCOUNTER — Other Ambulatory Visit: Payer: Self-pay | Admitting: Obstetrics and Gynecology

## 2024-02-11 DIAGNOSIS — Z1231 Encounter for screening mammogram for malignant neoplasm of breast: Secondary | ICD-10-CM

## 2024-06-08 ENCOUNTER — Ambulatory Visit
Admission: RE | Admit: 2024-06-08 | Discharge: 2024-06-08 | Disposition: A | Discharge status: Home / Self Care | Source: Ambulatory Visit | Attending: Obstetrics and Gynecology | Admitting: Obstetrics and Gynecology

## 2024-06-08 DIAGNOSIS — Z1231 Encounter for screening mammogram for malignant neoplasm of breast: Secondary | ICD-10-CM | POA: Insufficient documentation
